# Patient Record
Sex: Female | Born: 1975 | Race: Asian | Hispanic: No | Smoking: Never smoker
Health system: Southern US, Community
[De-identification: ages and names within clinical notes are randomized; demographics above are authoritative.]

## PROBLEM LIST (undated history)

## (undated) DIAGNOSIS — E042 Nontoxic multinodular goiter: Secondary | ICD-10-CM

## (undated) DIAGNOSIS — M549 Dorsalgia, unspecified: Secondary | ICD-10-CM

## (undated) DIAGNOSIS — F419 Anxiety disorder, unspecified: Secondary | ICD-10-CM

## (undated) DIAGNOSIS — F329 Major depressive disorder, single episode, unspecified: Secondary | ICD-10-CM

## (undated) DIAGNOSIS — I10 Essential (primary) hypertension: Secondary | ICD-10-CM

## (undated) DIAGNOSIS — E876 Hypokalemia: Secondary | ICD-10-CM

## (undated) DIAGNOSIS — E119 Type 2 diabetes mellitus without complications: Secondary | ICD-10-CM

## (undated) DIAGNOSIS — E0590 Thyrotoxicosis, unspecified without thyrotoxic crisis or storm: Secondary | ICD-10-CM

## (undated) DIAGNOSIS — F32A Depression, unspecified: Secondary | ICD-10-CM

## (undated) DIAGNOSIS — C801 Malignant (primary) neoplasm, unspecified: Secondary | ICD-10-CM

## (undated) HISTORY — DX: Major depressive disorder, single episode, unspecified: F32.9

## (undated) HISTORY — DX: Type 2 diabetes mellitus without complications: E11.9

## (undated) HISTORY — DX: Hypokalemia: E87.6

## (undated) HISTORY — DX: Dorsalgia, unspecified: M54.9

## (undated) HISTORY — DX: Nontoxic multinodular goiter: E04.2

## (undated) HISTORY — DX: Depression, unspecified: F32.A

## (undated) HISTORY — DX: Thyrotoxicosis, unspecified without thyrotoxic crisis or storm: E05.90

## (undated) HISTORY — DX: Malignant (primary) neoplasm, unspecified: C80.1

## (undated) HISTORY — DX: Essential (primary) hypertension: I10

## (undated) HISTORY — DX: Anxiety disorder, unspecified: F41.9

---

## 2009-05-02 ENCOUNTER — Encounter: Payer: Self-pay | Admitting: Endocrinology

## 2009-05-16 ENCOUNTER — Encounter: Payer: Self-pay | Admitting: Endocrinology

## 2009-05-19 ENCOUNTER — Encounter (INDEPENDENT_AMBULATORY_CARE_PROVIDER_SITE_OTHER): Payer: Self-pay | Admitting: *Deleted

## 2009-05-19 ENCOUNTER — Ambulatory Visit: Payer: Self-pay | Admitting: Endocrinology

## 2009-05-19 DIAGNOSIS — E042 Nontoxic multinodular goiter: Secondary | ICD-10-CM | POA: Insufficient documentation

## 2009-05-19 DIAGNOSIS — E0590 Thyrotoxicosis, unspecified without thyrotoxic crisis or storm: Secondary | ICD-10-CM | POA: Insufficient documentation

## 2009-06-12 ENCOUNTER — Encounter (HOSPITAL_COMMUNITY): Admission: RE | Admit: 2009-06-12 | Discharge: 2009-07-14 | Payer: Self-pay | Admitting: Endocrinology

## 2009-06-16 ENCOUNTER — Encounter (INDEPENDENT_AMBULATORY_CARE_PROVIDER_SITE_OTHER): Payer: Self-pay | Admitting: *Deleted

## 2009-06-22 ENCOUNTER — Telehealth: Payer: Self-pay | Admitting: Endocrinology

## 2009-11-07 ENCOUNTER — Ambulatory Visit: Payer: Self-pay | Admitting: Endocrinology

## 2009-11-07 LAB — CONVERTED CEMR LAB
Free T4: 0.8 ng/dL (ref 0.6–1.6)
TSH: 0.54 microintl units/mL (ref 0.35–5.50)

## 2010-02-12 ENCOUNTER — Ambulatory Visit: Payer: Self-pay | Admitting: Endocrinology

## 2010-02-12 LAB — CONVERTED CEMR LAB: TSH: 0.37 microintl units/mL (ref 0.35–5.50)

## 2010-03-11 ENCOUNTER — Emergency Department (HOSPITAL_BASED_OUTPATIENT_CLINIC_OR_DEPARTMENT_OTHER): Admission: EM | Admit: 2010-03-11 | Discharge: 2010-03-11 | Payer: Self-pay | Admitting: Emergency Medicine

## 2010-03-11 ENCOUNTER — Ambulatory Visit: Payer: Self-pay | Admitting: Diagnostic Radiology

## 2010-08-05 ENCOUNTER — Encounter: Payer: Self-pay | Admitting: Endocrinology

## 2010-08-14 NOTE — Assessment & Plan Note (Signed)
Summary: thyroid check-lb   Vital Signs:  Patient profile:   35 year old female Height:      57 inches (144.78 cm) Weight:      133.50 pounds (60.68 kg) O2 Sat:      98 % on Room air Temp:     98.3 degrees F (36.83 degrees C) oral Pulse rate:   85 / minute BP sitting:   114 / 72  (left arm) Cuff size:   regular  Vitals Entered By: Josph Macho RMA (November 07, 2009 2:50 PM)  O2 Flow:  Room air CC: Thyroid follow up/ CF Is Patient Diabetic? No   Referring Provider:  Simone Curia Primary Provider:  Simone Curia  CC:  Thyroid follow up/ CF.  History of Present Illness: pt has h/o multnodular goiter.  she says she cannot be isolated from her dtr after i-131 rx, and would like to have anti-thyroid medication instead.   pt states she feels well in general, and does not notice the goiter.  Current Medications (verified): 1)  None  Allergies (verified): No Known Drug Allergies  Past History:  Past Medical History: HYPERTHYROIDISM (ICD-242.90) GOITER, MULTINODULAR (ICD-241.1)  Social History: Reviewed history from 05/19/2009 and no changes required. 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    Physical Exam  General:  normal appearance.   Neck:  i think i feel a multinodular goiter, but i cannot tell details Additional Exam:  FastTSH                   0.54 uIU/mL                 0.35-5.50 Free T4                   0.8 ng/dL     Impression & Recommendations:  Problem # 1:  GOITER, MULTINODULAR (ICD-241.1) Assessment Unchanged  Problem # 2:  HYPERTHYROIDISM (ICD-242.90) Assessment: Improved  Other Orders: TLB-TSH (Thyroid Stimulating Hormone) (84443-TSH) TLB-T4 (Thyrox), Free [phone]) Est. Patient Level III (40981)  Patient Instructions: 1)  please do blood tests today.  based on the results, i will likely recommend a daily medication for the thyroid. 2)  if ever  you have fever while taking this medication, stop it and call us, because of the risk of a rare side-effect 3)  we can reconsider the iodine treatment at a future date. 4)  i will call marilyn su with results at [phone]. 5)  (update: i called marilyn su 11/08/09.  no rx is needed now.  ret 3 mos).

## 2010-08-14 NOTE — Assessment & Plan Note (Signed)
Summary: FU  PER PT---STC   Vital Signs:  Patient profile:   35 year old female Height:      57 inches (144.78 cm) Weight:      134.50 pounds (61.14 kg) BMI:     29.21 O2 Sat:      98 % on Room air Temp:     98.0 degrees F (36.67 degrees C) oral Pulse rate:   77 / minute BP sitting:   122 / 72  (left arm) Cuff size:   regular  Vitals Entered By: Brenton Grills Tanis (February 12, 2010 1:10 PM)  O2 Flow:  Room air CC: F/U appt/aj   Referring Provider:  Simone Curia Primary Provider:  Simone Curia  CC:  F/U appt/aj.  History of Present Illness: pt has h/o multnodular goiter.  pt states she feels well in general, and does not notice the goiter.  she says she is not at risk for pregnancy.  Current Medications (verified): 1)  None  Allergies (verified): No Known Drug Allergies  Past History:  Past Medical History: Last updated: 11/07/2009 HYPERTHYROIDISM (ICD-242.90) GOITER, MULTINODULAR (ICD-241.1)  Family History: Reviewed history from 05/19/2009 and no changes required. 2 sibs have uncertain type of thyroid condition  Review of Systems       no difficulty swallowing or breathing.  Physical Exam  General:  obese.  no distress  Neck:  small multinodular goiter.   Skin:  not diaphoretic.   Additional Exam:   FastTSH                   0.37 uIU/mL      Impression & Recommendations:  Problem # 1:  GOITER, MULTINODULAR (ICD-241.1) Assessment Improved no rx needed now  Problem # 2:  HYPERTHYROIDISM (ICD-242.90) Assessment: Improved  Other Orders: TLB-TSH (Thyroid Stimulating Hormone) (84443-TSH) Est. Patient Level III (36644)  Patient Instructions: 1)  blood tests are being ordered for you today.  2)  Please schedule a follow-up appointment in 6 months. 3)  i will call marilyn su with results at [phone]. 4)  (update:  i called marilyn su.  no rx needed now.  ret 6 mos).

## 2010-09-28 LAB — PREGNANCY, URINE: Preg Test, Ur: NEGATIVE

## 2010-11-19 ENCOUNTER — Ambulatory Visit (INDEPENDENT_AMBULATORY_CARE_PROVIDER_SITE_OTHER): Payer: Medicaid Other | Admitting: Endocrinology

## 2010-11-19 ENCOUNTER — Encounter: Payer: Self-pay | Admitting: Endocrinology

## 2010-11-19 ENCOUNTER — Other Ambulatory Visit (INDEPENDENT_AMBULATORY_CARE_PROVIDER_SITE_OTHER): Payer: Medicaid Other

## 2010-11-19 VITALS — BP 124/88 | HR 79 | Temp 98.1°F | Ht 60.0 in | Wt 131.8 lb

## 2010-11-19 DIAGNOSIS — E042 Nontoxic multinodular goiter: Secondary | ICD-10-CM

## 2010-11-19 LAB — TSH: TSH: 0.39 u[IU]/mL (ref 0.35–5.50)

## 2010-11-19 NOTE — Progress Notes (Signed)
  Subjective:    Patient ID: Ariana Thomas, female    DOB: [birth year], 35 y.o.   MRN: [id number]  HPI Pt returns for f/u of multinodular goiter.  She has had a slightly low tsh, but she did not do i-131 rx, because she could not be isolated from her dtr.  pt states she feels well in general.  She does not notice the goiter. Past Medical History  Diagnosis Date  . 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 Nontoxic multinodular goiter     No past surgical history on file.  History   Social History  . Marital Status: Widowed    Spouse Name: N/A    Number of Children: N/A  . Years of Education: N/A   Occupational History  . Not on file.   Social History Main Topics  . Smoking status: Never Smoker   . Smokeless tobacco: Not on file  . Alcohol Use: Not on file  . Drug Use: Not on file  . Sexually Active: Not on file   Other Topics Concern  . 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    No current outpatient prescriptions on file prior to visit.    No Known Allergies  Family History  Problem Relation Age of Onset  . Thyroid disease Other     2 siblings    BP 124/88  Pulse 79  Temp(Src) 98.1 F (36.7 C) (Oral)  Ht 5' (1.524 m)  Wt 131 lb 12.8 oz (59.784 kg)  BMI 25.74 kg/m2  SpO2 98%    Review of Systems Denies weight change.    Objective:   Physical Exam GENERAL: no distress Neck:  Thyroid is enlarged, with multinodular surface.    Lab Results  Component Value Date   TSH 0.39 11/19/2010     Assessment & Plan:  multinodular goiter, unchanged Mild hyperthyroidism, due to the goiter, improved, but will likely recur.

## 2010-11-19 NOTE — Patient Instructions (Addendum)
blood tests are being ordered for you today.  please call [phone] to hear your test results.  You will be prompted to enter the 9-digit "MRN" number that appears at the top left of this page, followed by #.  Then you will hear the message.  Please call us if you have any question about the result. Please make a follow-up appointment in 6 months. (update: i left message on phone-tree:  rx as we discussed.  No rx is needed now).

## 2011-07-30 ENCOUNTER — Encounter (HOSPITAL_BASED_OUTPATIENT_CLINIC_OR_DEPARTMENT_OTHER): Payer: Self-pay | Admitting: *Deleted

## 2011-07-30 ENCOUNTER — Emergency Department (HOSPITAL_BASED_OUTPATIENT_CLINIC_OR_DEPARTMENT_OTHER)
Admission: EM | Admit: 2011-07-30 | Discharge: 2011-07-30 | Disposition: A | Discharge status: Home / Self Care | Payer: No Typology Code available for payment source | Attending: Emergency Medicine | Admitting: Emergency Medicine

## 2011-07-30 ENCOUNTER — Emergency Department (INDEPENDENT_AMBULATORY_CARE_PROVIDER_SITE_OTHER): Payer: No Typology Code available for payment source

## 2011-07-30 DIAGNOSIS — M25519 Pain in unspecified shoulder: Secondary | ICD-10-CM

## 2011-07-30 DIAGNOSIS — R0781 Pleurodynia: Secondary | ICD-10-CM

## 2011-07-30 DIAGNOSIS — M542 Cervicalgia: Secondary | ICD-10-CM | POA: Insufficient documentation

## 2011-07-30 DIAGNOSIS — R079 Chest pain, unspecified: Secondary | ICD-10-CM | POA: Insufficient documentation

## 2011-07-30 DIAGNOSIS — S161XXA Strain of muscle, fascia and tendon at neck level, initial encounter: Secondary | ICD-10-CM

## 2011-07-30 DIAGNOSIS — Y92410 Unspecified street and highway as the place of occurrence of the external cause: Secondary | ICD-10-CM | POA: Insufficient documentation

## 2011-07-30 DIAGNOSIS — R51 Headache: Secondary | ICD-10-CM

## 2011-07-30 DIAGNOSIS — M549 Dorsalgia, unspecified: Secondary | ICD-10-CM

## 2011-07-30 DIAGNOSIS — S139XXA Sprain of joints and ligaments of unspecified parts of neck, initial encounter: Secondary | ICD-10-CM | POA: Insufficient documentation

## 2011-07-30 DIAGNOSIS — S4980XA Other specified injuries of shoulder and upper arm, unspecified arm, initial encounter: Secondary | ICD-10-CM

## 2011-07-30 MED ORDER — TRAMADOL HCL 50 MG PO TABS: 50.0000 mg | ORAL_TABLET | Freq: Four times a day (QID) | ORAL | Status: AC | PRN

## 2011-07-30 MED ORDER — ONDANSETRON HCL 4 MG/2ML IJ SOLN
INTRAMUSCULAR | Status: AC
  Filled 2011-07-30: qty 2
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
## 2011-07-30 NOTE — ED Notes (Signed)
Zofran was replaced for EMS

## 2011-07-30 NOTE — ED Provider Notes (Signed)
Medical screening examination/treatment/procedure(s) were performed by non-physician practitioner and as supervising physician I was immediately available for consultation/collaboration.   Rolan Bucco, MD 07/30/11 2102

## 2011-08-07 ENCOUNTER — Ambulatory Visit (INDEPENDENT_AMBULATORY_CARE_PROVIDER_SITE_OTHER)
Admission: RE | Admit: 2011-08-07 | Discharge: 2011-08-07 | Disposition: A | Discharge status: Home / Self Care | Payer: No Typology Code available for payment source | Source: Ambulatory Visit | Attending: Family Medicine | Admitting: Family Medicine

## 2011-08-07 ENCOUNTER — Ambulatory Visit (HOSPITAL_BASED_OUTPATIENT_CLINIC_OR_DEPARTMENT_OTHER)
Admission: RE | Admit: 2011-08-07 | Discharge: 2011-08-07 | Disposition: A | Discharge status: Home / Self Care | Payer: No Typology Code available for payment source | Source: Ambulatory Visit | Attending: Family Medicine | Admitting: Family Medicine

## 2011-08-07 ENCOUNTER — Encounter: Payer: Self-pay | Admitting: Family Medicine

## 2011-08-07 ENCOUNTER — Ambulatory Visit (INDEPENDENT_AMBULATORY_CARE_PROVIDER_SITE_OTHER): Payer: Self-pay | Admitting: Family Medicine

## 2011-08-07 DIAGNOSIS — M542 Cervicalgia: Secondary | ICD-10-CM

## 2011-08-07 DIAGNOSIS — R112 Nausea with vomiting, unspecified: Secondary | ICD-10-CM

## 2011-08-07 DIAGNOSIS — M25522 Pain in left elbow: Secondary | ICD-10-CM

## 2011-08-07 DIAGNOSIS — S59902A Unspecified injury of left elbow, initial encounter: Secondary | ICD-10-CM

## 2011-08-07 DIAGNOSIS — M549 Dorsalgia, unspecified: Secondary | ICD-10-CM

## 2011-08-07 DIAGNOSIS — S161XXA Strain of muscle, fascia and tendon at neck level, initial encounter: Secondary | ICD-10-CM

## 2011-08-07 DIAGNOSIS — S335XXA Sprain of ligaments of lumbar spine, initial encounter: Secondary | ICD-10-CM

## 2011-08-07 DIAGNOSIS — S0990XA Unspecified injury of head, initial encounter: Secondary | ICD-10-CM

## 2011-08-07 DIAGNOSIS — M25529 Pain in unspecified elbow: Secondary | ICD-10-CM | POA: Insufficient documentation

## 2011-08-07 DIAGNOSIS — S6990XA Unspecified injury of unspecified wrist, hand and finger(s), initial encounter: Secondary | ICD-10-CM

## 2011-08-07 DIAGNOSIS — R51 Headache: Secondary | ICD-10-CM | POA: Insufficient documentation

## 2011-08-07 DIAGNOSIS — R1110 Vomiting, unspecified: Secondary | ICD-10-CM

## 2011-08-07 DIAGNOSIS — S139XXA Sprain of joints and ligaments of unspecified parts of neck, initial encounter: Secondary | ICD-10-CM

## 2011-08-07 DIAGNOSIS — S39012A Strain of muscle, fascia and tendon of lower back, initial encounter: Secondary | ICD-10-CM

## 2011-08-07 MED ORDER — MELOXICAM 15 MG PO TABS: 15.0000 mg | ORAL_TABLET | Freq: Every day | ORAL | Status: AC

## 2011-08-07 MED ORDER — CYCLOBENZAPRINE HCL 5 MG PO TABS: 5.0000 mg | ORAL_TABLET | Freq: Three times a day (TID) | ORAL | Status: AC | PRN

## 2011-08-07 MED ORDER — ONDANSETRON HCL 4 MG PO TABS: 4.0000 mg | ORAL_TABLET | Freq: Every day | ORAL | Status: DC | PRN

## 2011-08-07 NOTE — Patient Instructions (Addendum)
Your x-rays and repeat head CT were negative for fractures, hemorrhage. Expect your symptoms from muscle strains, bruising to last on average 3-4 weeks though this is the worst you should feel. Start physical therapy for your low back, leg, and neck. Heat as needed for neck and low back. Consider massage as well to these areas. After physical therapy use ice to your left shoulder. Take meloxicam 15mg  daily with food for pain and inflammation. Take tylenol 500mg  - 1-2 tabs three times a day as needed for pain. Flexeril 1/2 tablet three times a day as needed for muscle spasms. Zofran 4mg  every 8 hours as needed for nausea/vomiting. If you're unable to keep the pills down they do make a dissolvable tablet for nausea - call me and I can call this medicine in as well. Follow up with me in 3 weeks for a recheck on your progress.
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
## 2011-08-08 DIAGNOSIS — S59902A Unspecified injury of left elbow, initial encounter: Secondary | ICD-10-CM | POA: Insufficient documentation

## 2011-08-08 DIAGNOSIS — S161XXA Strain of muscle, fascia and tendon at neck level, initial encounter: Secondary | ICD-10-CM | POA: Insufficient documentation

## 2011-08-08 DIAGNOSIS — R1110 Vomiting, unspecified: Secondary | ICD-10-CM | POA: Insufficient documentation

## 2011-08-08 DIAGNOSIS — S39012A Strain of muscle, fascia and tendon of lower back, initial encounter: Secondary | ICD-10-CM | POA: Insufficient documentation
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
## 2011-08-08 NOTE — Assessment & Plan Note (Signed)
left sided.  Will start physical therapy, flexeril, meloxicam, continue tylenol.
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
## 2011-08-08 NOTE — Assessment & Plan Note (Signed)
Left elbow contusion and upper arm strain (biceps, triceps) - x-rays today negative for elbow fracture or effusion.  Reassured.

## 2011-08-29 ENCOUNTER — Ambulatory Visit (INDEPENDENT_AMBULATORY_CARE_PROVIDER_SITE_OTHER): Payer: Self-pay | Admitting: Family Medicine

## 2011-08-29 ENCOUNTER — Encounter: Payer: Self-pay | Admitting: Family Medicine

## 2011-08-29 DIAGNOSIS — S139XXA Sprain of joints and ligaments of unspecified parts of neck, initial encounter: Secondary | ICD-10-CM

## 2011-08-29 DIAGNOSIS — S335XXA Sprain of ligaments of lumbar spine, initial encounter: Secondary | ICD-10-CM

## 2011-08-29 DIAGNOSIS — S060X9A Concussion with loss of consciousness of unspecified duration, initial encounter: Secondary | ICD-10-CM

## 2011-08-29 DIAGNOSIS — S161XXA Strain of muscle, fascia and tendon at neck level, initial encounter: Secondary | ICD-10-CM

## 2011-08-29 DIAGNOSIS — S59909A Unspecified injury of unspecified elbow, initial encounter: Secondary | ICD-10-CM

## 2011-08-29 DIAGNOSIS — S39012A Strain of muscle, fascia and tendon of lower back, initial encounter: Secondary | ICD-10-CM

## 2011-08-29 DIAGNOSIS — S59902A Unspecified injury of left elbow, initial encounter: Secondary | ICD-10-CM

## 2011-08-29 DIAGNOSIS — S060XAA Concussion with loss of consciousness status unknown, initial encounter: Secondary | ICD-10-CM

## 2011-08-29 NOTE — Patient Instructions (Signed)
You are improving as expected though this is a slow process. To accelerate this I think you do need to do physical therapy for your neck and your back. I expect your elbow pain to resolve with time. Heat for neck and low back as needed. Consider massage as well to these areas. Take meloxicam 15mg  daily with food for pain and inflammation. Take tylenol 500mg  - 1-2 tabs three times a day as needed for pain. Flexeril 1/2 tablet three times a day as needed for muscle spasms. Zofran 4mg  every 8 hours as needed for nausea/vomiting. Follow up with me in 1 month for a recheck on your progress.

## 2011-08-30 ENCOUNTER — Encounter: Payer: Self-pay | Admitting: Family Medicine

## 2011-08-30 DIAGNOSIS — S060X9A Concussion with loss of consciousness of unspecified duration, initial encounter: Secondary | ICD-10-CM | POA: Insufficient documentation

## 2011-08-30 NOTE — Assessment & Plan Note (Signed)
Left elbow contusion, traumatic lateral epicondylitis - improving - will monitor - can add PT for this in future.

## 2011-08-30 NOTE — Assessment & Plan Note (Signed)
left sided.  Did not start PT - willing to do so now.  Order rewritten.  Flexeril, meloxicam, continue tylenol prn.
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
## 2011-08-30 NOTE — Assessment & Plan Note (Signed)
symptoms significantly improved.  No cognitive issues.  Still with occasional nausea but not vomiting any longer.  Headaches intermittent.  Continue to monitor.  No red flag symptoms.  Has zofran to take prn.

## 2011-08-30 NOTE — Assessment & Plan Note (Signed)
improving.  will start physical therapy, flexeril, meloxicam, continue tylenol.

## 2011-09-05 ENCOUNTER — Ambulatory Visit: Payer: No Typology Code available for payment source | Attending: Family Medicine | Admitting: Physical Therapy

## 2011-09-05 DIAGNOSIS — M542 Cervicalgia: Secondary | ICD-10-CM | POA: Insufficient documentation

## 2011-09-05 DIAGNOSIS — M545 Low back pain, unspecified: Secondary | ICD-10-CM | POA: Insufficient documentation

## 2011-09-05 DIAGNOSIS — IMO0001 Reserved for inherently not codable concepts without codable children: Secondary | ICD-10-CM | POA: Insufficient documentation

## 2011-09-09 ENCOUNTER — Ambulatory Visit: Payer: No Typology Code available for payment source | Admitting: Physical Therapy

## 2011-09-12 ENCOUNTER — Ambulatory Visit: Payer: No Typology Code available for payment source | Admitting: Physical Therapy

## 2011-09-16 ENCOUNTER — Ambulatory Visit: Payer: No Typology Code available for payment source | Attending: Family Medicine | Admitting: Physical Therapy

## 2011-09-16 DIAGNOSIS — M545 Low back pain, unspecified: Secondary | ICD-10-CM | POA: Insufficient documentation

## 2011-09-16 DIAGNOSIS — IMO0001 Reserved for inherently not codable concepts without codable children: Secondary | ICD-10-CM | POA: Insufficient documentation

## 2011-09-16 DIAGNOSIS — M542 Cervicalgia: Secondary | ICD-10-CM | POA: Insufficient documentation

## 2011-09-19 ENCOUNTER — Ambulatory Visit: Payer: No Typology Code available for payment source | Admitting: Physical Therapy

## 2011-09-20 ENCOUNTER — Ambulatory Visit: Payer: No Typology Code available for payment source | Admitting: Physical Therapy

## 2011-09-23 ENCOUNTER — Ambulatory Visit: Payer: No Typology Code available for payment source | Admitting: Physical Therapy

## 2011-09-25 ENCOUNTER — Ambulatory Visit: Payer: No Typology Code available for payment source | Admitting: Physical Therapy

## 2011-09-26 ENCOUNTER — Ambulatory Visit (INDEPENDENT_AMBULATORY_CARE_PROVIDER_SITE_OTHER): Payer: Self-pay | Admitting: Family Medicine

## 2011-09-26 ENCOUNTER — Encounter: Payer: Self-pay | Admitting: Family Medicine

## 2011-09-26 VITALS — BP 155/84 | HR 76 | Temp 97.7°F | Ht <= 58 in | Wt 127.0 lb

## 2011-09-26 DIAGNOSIS — S139XXA Sprain of joints and ligaments of unspecified parts of neck, initial encounter: Secondary | ICD-10-CM

## 2011-09-26 DIAGNOSIS — S060X9A Concussion with loss of consciousness of unspecified duration, initial encounter: Secondary | ICD-10-CM

## 2011-09-26 DIAGNOSIS — S161XXA Strain of muscle, fascia and tendon at neck level, initial encounter: Secondary | ICD-10-CM

## 2011-09-26 DIAGNOSIS — M545 Low back pain: Secondary | ICD-10-CM

## 2011-09-26 MED ORDER — VITAMIN D 1000 UNITS PO TABS: 1000.0000 [IU] | ORAL_TABLET | Freq: Every day | ORAL | Status: AC

## 2011-09-26 NOTE — Assessment & Plan Note (Signed)
left sided.  Continue with PT, meloxicam and flexeril prn.

## 2011-09-26 NOTE — Patient Instructions (Signed)
You need to check with your insurance company(ies), lawyer as the medpay and insurances are being declined. An MRI, which I think is necessary with your left leg weakness, back pain, would cost around 1700 dollars without insurance. Continue current medications as you have been. Continue with PT. I sent in your vitamin D. Call me when you've sorted out the insurance information and we will go ahead with the MRI.

## 2011-09-26 NOTE — Assessment & Plan Note (Signed)
symptoms significantly improved.  No cognitive issues.
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
## 2011-09-26 NOTE — Assessment & Plan Note (Addendum)
Low back pain with radiation into left leg, weakness - weakness of particular concern, associated with numbness.  Has been compliant with PT, HEP.  Taking meloxicam with flexeril as needed.  Will move forward with lumbar spine MRI to assess for disc herniation, rupture and source of lumbar radiculopathy.

## 2011-09-27 ENCOUNTER — Ambulatory Visit: Payer: No Typology Code available for payment source | Admitting: Physical Therapy

## 2011-09-30 ENCOUNTER — Ambulatory Visit: Payer: No Typology Code available for payment source | Admitting: Physical Therapy

## 2011-10-01 ENCOUNTER — Ambulatory Visit (HOSPITAL_BASED_OUTPATIENT_CLINIC_OR_DEPARTMENT_OTHER)
Admission: RE | Admit: 2011-10-01 | Discharge: 2011-10-01 | Disposition: A | Discharge status: Home / Self Care | Payer: No Typology Code available for payment source | Source: Ambulatory Visit | Attending: Family Medicine | Admitting: Family Medicine

## 2011-10-01 DIAGNOSIS — M479 Spondylosis, unspecified: Secondary | ICD-10-CM

## 2011-10-01 DIAGNOSIS — IMO0002 Reserved for concepts with insufficient information to code with codable children: Secondary | ICD-10-CM

## 2011-10-01 DIAGNOSIS — M5137 Other intervertebral disc degeneration, lumbosacral region: Secondary | ICD-10-CM | POA: Insufficient documentation

## 2011-10-01 DIAGNOSIS — M51379 Other intervertebral disc degeneration, lumbosacral region without mention of lumbar back pain or lower extremity pain: Secondary | ICD-10-CM | POA: Insufficient documentation

## 2011-10-01 DIAGNOSIS — R209 Unspecified disturbances of skin sensation: Secondary | ICD-10-CM | POA: Insufficient documentation

## 2011-10-01 DIAGNOSIS — M545 Low back pain, unspecified: Secondary | ICD-10-CM

## 2011-10-02 ENCOUNTER — Ambulatory Visit: Payer: No Typology Code available for payment source | Admitting: Physical Therapy

## 2011-10-04 ENCOUNTER — Ambulatory Visit: Payer: No Typology Code available for payment source | Admitting: Physical Therapy

## 2011-10-07 ENCOUNTER — Ambulatory Visit: Payer: No Typology Code available for payment source | Admitting: Physical Therapy

## 2011-10-09 ENCOUNTER — Other Ambulatory Visit: Payer: Self-pay | Admitting: Family Medicine

## 2011-10-09 ENCOUNTER — Ambulatory Visit: Payer: No Typology Code available for payment source | Admitting: Physical Therapy

## 2011-10-11 ENCOUNTER — Encounter: Payer: No Typology Code available for payment source | Admitting: Physical Therapy

## 2011-10-14 ENCOUNTER — Ambulatory Visit: Payer: No Typology Code available for payment source | Attending: Family Medicine | Admitting: Physical Therapy

## 2011-10-14 DIAGNOSIS — M542 Cervicalgia: Secondary | ICD-10-CM | POA: Insufficient documentation

## 2011-10-14 DIAGNOSIS — IMO0001 Reserved for inherently not codable concepts without codable children: Secondary | ICD-10-CM | POA: Insufficient documentation

## 2011-10-14 DIAGNOSIS — M545 Low back pain, unspecified: Secondary | ICD-10-CM | POA: Insufficient documentation

## 2011-10-18 ENCOUNTER — Ambulatory Visit: Payer: No Typology Code available for payment source | Admitting: Physical Therapy

## 2011-10-21 ENCOUNTER — Ambulatory Visit: Payer: No Typology Code available for payment source | Admitting: Physical Therapy

## 2011-10-24 ENCOUNTER — Ambulatory Visit: Payer: No Typology Code available for payment source | Admitting: Physical Therapy

## 2011-10-28 ENCOUNTER — Ambulatory Visit: Payer: No Typology Code available for payment source | Admitting: Physical Therapy

## 2011-10-29 ENCOUNTER — Ambulatory Visit (INDEPENDENT_AMBULATORY_CARE_PROVIDER_SITE_OTHER): Payer: Medicaid Other | Admitting: Family Medicine

## 2011-10-29 ENCOUNTER — Encounter: Payer: Self-pay | Admitting: Family Medicine

## 2011-10-29 VITALS — BP 139/89 | HR 83 | Temp 98.0°F | Ht <= 58 in | Wt 125.0 lb

## 2011-10-29 DIAGNOSIS — M545 Low back pain: Secondary | ICD-10-CM

## 2011-10-29 DIAGNOSIS — S060X9A Concussion with loss of consciousness of unspecified duration, initial encounter: Secondary | ICD-10-CM

## 2011-10-29 DIAGNOSIS — S139XXA Sprain of joints and ligaments of unspecified parts of neck, initial encounter: Secondary | ICD-10-CM

## 2011-10-29 DIAGNOSIS — S161XXA Strain of muscle, fascia and tendon at neck level, initial encounter: Secondary | ICD-10-CM

## 2011-10-29 NOTE — Assessment & Plan Note (Signed)
much improved.  Continue HEP, mobic as needed now.  MRI showed very small disc bulge at L4-5 which was likely cause of symptoms into left leg but this has shrunk down since then and her strength has come back as expected.  F/u in 2-3 months for likely final appointment.
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
## 2011-10-29 NOTE — Assessment & Plan Note (Signed)
left sided.  Continue HEP.  Should continue to improve.

## 2011-10-29 NOTE — Assessment & Plan Note (Signed)
symptoms significantly improved.  No cognitive issues.

## 2011-10-31 ENCOUNTER — Ambulatory Visit: Payer: No Typology Code available for payment source | Admitting: Physical Therapy

## 2011-11-04 ENCOUNTER — Ambulatory Visit: Payer: No Typology Code available for payment source | Admitting: Physical Therapy

## 2011-11-07 ENCOUNTER — Ambulatory Visit: Payer: No Typology Code available for payment source | Admitting: Physical Therapy

## 2011-11-12 ENCOUNTER — Ambulatory Visit: Payer: No Typology Code available for payment source | Admitting: Physical Therapy

## 2011-11-21 ENCOUNTER — Ambulatory Visit: Payer: No Typology Code available for payment source | Attending: Family Medicine | Admitting: Physical Therapy

## 2011-11-21 DIAGNOSIS — M545 Low back pain, unspecified: Secondary | ICD-10-CM | POA: Insufficient documentation

## 2011-11-21 DIAGNOSIS — M542 Cervicalgia: Secondary | ICD-10-CM | POA: Insufficient documentation

## 2011-11-21 DIAGNOSIS — IMO0001 Reserved for inherently not codable concepts without codable children: Secondary | ICD-10-CM | POA: Insufficient documentation

## 2011-11-25 ENCOUNTER — Ambulatory Visit: Payer: No Typology Code available for payment source | Admitting: Physical Therapy

## 2011-11-27 ENCOUNTER — Ambulatory Visit: Payer: No Typology Code available for payment source | Admitting: Physical Therapy

## 2011-12-02 ENCOUNTER — Ambulatory Visit: Payer: No Typology Code available for payment source | Admitting: Physical Therapy

## 2011-12-04 ENCOUNTER — Encounter: Payer: Medicaid Other | Admitting: Physical Therapy

## 2011-12-10 ENCOUNTER — Ambulatory Visit: Payer: No Typology Code available for payment source | Admitting: Physical Therapy

## 2011-12-12 ENCOUNTER — Encounter: Payer: Medicaid Other | Admitting: Physical Therapy

## 2011-12-17 ENCOUNTER — Ambulatory Visit: Payer: No Typology Code available for payment source | Attending: Family Medicine | Admitting: Physical Therapy

## 2011-12-17 DIAGNOSIS — M545 Low back pain, unspecified: Secondary | ICD-10-CM | POA: Insufficient documentation

## 2011-12-17 DIAGNOSIS — M542 Cervicalgia: Secondary | ICD-10-CM | POA: Insufficient documentation

## 2011-12-17 DIAGNOSIS — IMO0001 Reserved for inherently not codable concepts without codable children: Secondary | ICD-10-CM | POA: Insufficient documentation

## 2012-01-21 ENCOUNTER — Encounter: Payer: Self-pay | Admitting: Family Medicine

## 2012-01-21 ENCOUNTER — Ambulatory Visit (INDEPENDENT_AMBULATORY_CARE_PROVIDER_SITE_OTHER): Payer: Medicaid Other | Admitting: Family Medicine

## 2012-01-21 VITALS — BP 120/90 | HR 85 | Temp 98.0°F | Ht <= 58 in | Wt 127.0 lb

## 2012-01-21 DIAGNOSIS — M545 Low back pain: Secondary | ICD-10-CM

## 2012-01-21 DIAGNOSIS — M79605 Pain in left leg: Secondary | ICD-10-CM

## 2012-01-22 ENCOUNTER — Encounter: Payer: Self-pay | Admitting: Family Medicine

## 2012-01-22 NOTE — Assessment & Plan Note (Signed)
much improved but with occasional low back pain.  I believe at this point this is likely her new baseline.  It does not limit her function but she's likely to have good and bad days, flares in the future as a result.  Numbness can take up to 1- 1 1/2 years to know if this is going to linger or resolve given amount of time nerves take to heal.  Continue HEP and nsaids as needed.  Will release her at this time.  F/u prn.
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
## 2013-02-26 ENCOUNTER — Ambulatory Visit (INDEPENDENT_AMBULATORY_CARE_PROVIDER_SITE_OTHER): Payer: Medicaid Other | Admitting: Family Medicine

## 2013-02-26 ENCOUNTER — Encounter: Payer: Self-pay | Admitting: Family Medicine

## 2013-02-26 VITALS — BP 152/95 | HR 79 | Ht <= 58 in | Wt 127.0 lb

## 2013-02-26 DIAGNOSIS — M549 Dorsalgia, unspecified: Secondary | ICD-10-CM

## 2013-02-26 DIAGNOSIS — M545 Low back pain: Secondary | ICD-10-CM

## 2013-02-26 NOTE — Patient Instructions (Addendum)
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 Start with physical therapy but also do home exercises on days you don't go to therapy. Consider using a tennis ball to massage the area of pain when you're sitting. Heat as needed 15 minutes at a time for muscle spasms. Motrin 600mg  three times a day with food as needed for pain and inflammation. Follow up with me in 5-6 weeks for reevaluation.

## 2013-03-03 ENCOUNTER — Ambulatory Visit: Payer: Medicaid Other | Attending: Family Medicine | Admitting: Physical Therapy

## 2013-03-03 ENCOUNTER — Encounter: Payer: Self-pay | Admitting: Family Medicine

## 2013-03-03 DIAGNOSIS — IMO0001 Reserved for inherently not codable concepts without codable children: Secondary | ICD-10-CM | POA: Insufficient documentation

## 2013-03-03 DIAGNOSIS — M545 Low back pain, unspecified: Secondary | ICD-10-CM | POA: Insufficient documentation

## 2013-03-03 DIAGNOSIS — M542 Cervicalgia: Secondary | ICD-10-CM | POA: Insufficient documentation

## 2013-03-03 DIAGNOSIS — M549 Dorsalgia, unspecified: Secondary | ICD-10-CM | POA: Insufficient documentation

## 2013-03-03 NOTE — Assessment & Plan Note (Signed)
different issue than what she was seen for a year ago, down opposite leg.  Exam consistent with SI joint dysfunction and piriformis syndrome.  Discussed options.  Will start with PT and HEP.  Tennis ball massage, nsaids, heat.  F/u in 5-6 weeks for reevaluation.
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
## 2015-10-11 ENCOUNTER — Ambulatory Visit (INDEPENDENT_AMBULATORY_CARE_PROVIDER_SITE_OTHER): Payer: Medicaid Other | Admitting: Internal Medicine

## 2015-10-11 ENCOUNTER — Encounter: Payer: Self-pay | Admitting: Internal Medicine

## 2015-10-11 VITALS — BP 130/80 | HR 84 | Temp 98.0°F | Resp 16 | Ht <= 58 in | Wt 120.6 lb

## 2015-10-11 DIAGNOSIS — J3089 Other allergic rhinitis: Secondary | ICD-10-CM | POA: Diagnosis not present

## 2015-10-11 DIAGNOSIS — J309 Allergic rhinitis, unspecified: Secondary | ICD-10-CM | POA: Insufficient documentation

## 2015-10-11 MED ORDER — AZELASTINE-FLUTICASONE 137-50 MCG/ACT NA SUSP: 1.0000 | Freq: Two times a day (BID) | NASAL | Status: AC

## 2015-10-11 MED ORDER — OLOPATADINE HCL 0.2 % OP SOLN: 1.0000 [drp] | OPHTHALMIC | Status: AC

## 2015-10-11 MED ORDER — LEVOCETIRIZINE DIHYDROCHLORIDE 5 MG PO TABS: 5.0000 mg | ORAL_TABLET | Freq: Every day | ORAL | Status: AC

## 2015-10-11 NOTE — Assessment & Plan Note (Addendum)
   Allergic to tree pollen, grass pollen, weeds pollen, cat dander, mold, dust mite. Handouts given  Continue Xyzal (levocetirizine) 5 mg daily  Stop fluticasone and start Dymista 1 spray each nostril twice a day  Start Pataday 1 drop each eye daily  Consider allergy injections. She would be a good candidate. She will let us know  Will avoid Singulair due to history of anxiety and depression.

## 2015-10-11 NOTE — Progress Notes (Signed)
Referring provider: Renato Shin, MD [facility] [employer] [address], [place] [address]  History of Present Illness:  Ariana Thomas is a 40 y.o. female seen in consultation at the kind request of Dr. Loanne Drilling for rhinitis  HPI Comments: Rhinoconjunctivitis: Patient moved to [place] from [place] about 8 years ago. For the past 6 years, she has had persistent symptoms but they are worse in the spring and fall. She gets sinus infections about once a year requiring antibiotics. She has tried Xyzal, fluticasone, an unidentified allergy eyedrops with transient relief of her symptoms; however, she is not using them on a regular basis.   No current outpatient prescriptions on file prior to visit.   No current facility-administered medications on file prior to visit.    Assessment and Plan: Allergic rhinitis  Allergic to tree pollen, grass pollen, weeds pollen, cat dander, mold, dust mite. Handouts given  Continue Xyzal (levocetirizine) 5 mg daily  Stop fluticasone and start Dymista 1 spray each nostril twice a day  Start Pataday 1 drop each eye daily  Consider allergy injections. She would be a good candidate. She will let us know  Will avoid Singulair due to history of anxiety and depression.    Return in about 4 weeks (around 11/08/2015).  Meds ordered this encounter  Medications  . DISCONTD: fluticasone (FLONASE) 50 MCG/ACT nasal spray    Sig:   . ibuprofen (ADVIL,MOTRIN) 800 MG tablet    Sig: Take 800 mg by mouth.  . DISCONTD: levocetirizine (XYZAL) 5 MG tablet    Sig: Take 5 mg by mouth.  . methyldopa (ALDOMET) 500 MG tablet    Sig: Take 500 mg by mouth.  . QUEtiapine (SEROQUEL) 50 MG tablet    Sig:   . metoprolol (LOPRESSOR) 50 MG tablet    Sig: TAKE 1 TABLET (50 MG TOTAL) BY MOUTH TWO (2) TIMES A DAY.    Refill:  3  . PARoxetine (PAXIL) 30 MG tablet    Sig: TAKE 1 TABLET (30 MG TOTAL) BY MOUTH EVERY MORNING.    Refill:  0  . Cholecalciferol (VITAMIN D PO)    Sig: Take  by mouth.  . Ascorbic Acid (VITAMIN C PO)    Sig: Take by mouth.  . levocetirizine (XYZAL) 5 MG tablet    Sig: Take 1 tablet (5 mg total) by mouth daily.    Dispense:  34 tablet    Refill:  5    Runny nose or itching  . Olopatadine HCl (PATADAY) 0.2 % SOLN    Sig: Place 1 drop into both eyes 1 day or 1 dose.    Dispense:  1 Bottle    Refill:  5    For itchy eyes  . Azelastine-Fluticasone (DYMISTA) 137-50 MCG/ACT SUSP    Sig: Place 1 spray into both nostrils 2 (two) times daily.    Dispense:  1 Bottle    Refill:  5    For runny nose    Diagnostics: Aeroallergen skin testing: positive for grass, weed, tree, mold, cat, mite with a good histamine control.  Skin tests were interpreted by me, transferred into EPIC by CMA, reviewed and accepted by me into EPIC.  Physical Exam: BP 130/80 mmHg  Pulse 84  Temp(Src) 98 F (36.7 C) (Oral)  Resp 16  Ht 4' 9.68" (1.465 m)  Wt 120 lb 9.5 oz (54.7 kg)  BMI 25.49 kg/m2   Physical Exam  Constitutional: She appears well-developed and well-nourished. No distress.  HENT:  Right  Ear: External ear normal.  Left Ear: External ear normal.  Mouth/Throat: Oropharynx is clear and moist.  Nasal membrane edema with clear rhinorrhea  Eyes: Conjunctivae are normal. Right eye exhibits no discharge. Left eye exhibits no discharge.  Cardiovascular: Normal rate, regular rhythm and normal heart sounds.   No murmur heard. Pulmonary/Chest: Effort normal and breath sounds normal. No respiratory distress. She has no wheezes. She has no rales.  Abdominal: Soft. Bowel sounds are normal.  Musculoskeletal: She exhibits no edema.  Lymphadenopathy:    She has no cervical adenopathy.  Neurological: She is alert.  Skin: No rash noted.  Vitals reviewed.   Review of systems: Per HPI unless specifically indicated below Review of Systems  Constitutional: Negative for fever, chills, appetite change and unexpected weight change.  HENT: Positive for congestion,  postnasal drip, rhinorrhea, sinus pressure and sneezing. Negative for ear pain and sore throat.   Eyes: Positive for discharge and itching. Negative for pain.  Respiratory: Negative for cough, chest tightness and wheezing.   Cardiovascular: Negative for chest pain and leg swelling.  Gastrointestinal: Negative for vomiting and diarrhea.  Genitourinary: Negative for difficulty urinating.  Musculoskeletal: Negative for joint swelling and arthralgias.  Skin: Negative for rash.  Allergic/Immunologic: Positive for environmental allergies. Negative for food allergies and immunocompromised state.       No hymenoptera stings No latex allergy  Neurological: Negative for seizures.    Past medical history:  Patient Active Problem List   Diagnosis Date Noted  . Allergic rhinitis 10/11/2015  . Back pain 03/03/2013  . Low back pain radiating to left leg 09/26/2011  . Head concussion 08/30/2011  . Cervical strain 08/08/2011  . Lumbar strain 08/08/2011  . Injury of left elbow 08/08/2011  . GOITER, MULTINODULAR 05/19/2009  . HYPERTHYROIDISM 05/19/2009    Past surgical history: Past Surgical History  Procedure Laterality Date  . Cesarean section    . Cesarean section  2005    Family history: Family History  Problem Relation Age of Onset  . Thyroid disease Other     2 siblings  . Hypertension Father   . Asthma Father   . Hyperlipidemia Neg Hx   . Heart attack Neg Hx   . Diabetes Neg Hx   . Sudden death Neg Hx     Environmental/Social history: She lives in a home that is 39 years of age, she has a non-feather pillow and comforter, there is laminate flooring in the home, there is central action heating, there is a fish in the home, there is a finished basement home, her husband smokes outdoors but she is a nonsmoker herself, she works as a Scientist, water quality at [employer].  Drug Allergies:  Allergies  Allergen Reactions  . Hydrocodone-Homatropine [Hydrocodone-Homatropine] Shortness Of Breath     Thank you for the opportunity to care for this patient.  Please do not hesitate to contact me with questions.

## 2015-10-11 NOTE — Patient Instructions (Addendum)
Allergic rhinitis  Allergic to tree pollen, grass pollen, weeds pollen, cat dander, mold, dust mite. Handouts given  Continue Xyzal (levocetirizine) 5 mg daily  Stop fluticasone and start Dymista 1 spray each nostril twice a day  Start Pataday 1 drop each eye daily  Consider allergy injections. She would be a good candidate. She will let us know  Will avoid Singulair due to history of anxiety and depression.

## 2015-10-16 ENCOUNTER — Encounter: Payer: Self-pay | Admitting: *Deleted

## 2015-10-16 ENCOUNTER — Telehealth: Payer: Self-pay | Admitting: Internal Medicine

## 2015-10-16 NOTE — Telephone Encounter (Signed)
xyzal and dymista not covered by pt insurance.  Please switch to fluticasone 1 spray twice a day, patanase 1 spray twice a day and cetirizine 10 mg daily

## 2015-10-17 ENCOUNTER — Other Ambulatory Visit: Payer: Self-pay | Admitting: Allergy

## 2015-10-17 MED ORDER — FLUTICASONE PROPIONATE 50 MCG/ACT NA SUSP: 1.0000 | Freq: Two times a day (BID) | NASAL | Status: AC

## 2015-10-17 MED ORDER — OLOPATADINE HCL 0.6 % NA SOLN: 1.0000 | Freq: Two times a day (BID) | NASAL | Status: AC

## 2015-10-17 MED ORDER — CETIRIZINE HCL 10 MG PO TABS: 10.0000 mg | ORAL_TABLET | Freq: Every day | ORAL | Status: DC

## 2015-10-17 NOTE — Telephone Encounter (Signed)
Faxed in rxs for fluticasone,patanase and cetirizine. Per Dr Emilio Math

## 2016-08-21 ENCOUNTER — Other Ambulatory Visit: Payer: Self-pay | Admitting: Allergy

## 2016-08-21 ENCOUNTER — Telehealth: Payer: Self-pay | Admitting: Allergy

## 2016-08-21 MED ORDER — OLOPATADINE HCL 0.1 % OP SOLN: 1.0000 [drp] | Freq: Two times a day (BID) | OPHTHALMIC | 5 refills | Status: AC

## 2016-08-21 NOTE — Telephone Encounter (Signed)
Changed to patanol.

## 2016-10-21 ENCOUNTER — Other Ambulatory Visit: Payer: Self-pay | Admitting: Allergy

## 2016-10-21 MED ORDER — CETIRIZINE HCL 10 MG PO TABS: 10.0000 mg | ORAL_TABLET | Freq: Every day | ORAL | 5 refills | Status: AC
